# Patient Record
Sex: Male | Born: 1990 | Race: White | Hispanic: No | Marital: Single | Smoking: Never smoker
Health system: Southern US, Community
[De-identification: ages and names within clinical notes are randomized; demographics above are authoritative.]

## PROBLEM LIST (undated history)

## (undated) ENCOUNTER — Ambulatory Visit: Payer: BC Managed Care – PPO

## (undated) HISTORY — PX: VARICOSE VEIN SURGERY: SHX832

---
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
## 2012-11-19 ENCOUNTER — Ambulatory Visit: Payer: Self-pay | Admitting: Family Medicine

## 2013-02-24 ENCOUNTER — Ambulatory Visit: Payer: Self-pay

## 2015-09-30 ENCOUNTER — Ambulatory Visit: Payer: Self-pay | Admitting: Physician Assistant

## 2015-09-30 ENCOUNTER — Encounter: Payer: Self-pay | Admitting: Physician Assistant

## 2015-09-30 VITALS — BP 129/80 | HR 96 | Temp 98.3°F

## 2015-09-30 DIAGNOSIS — Z0000 Encounter for general adult medical examination without abnormal findings: Secondary | ICD-10-CM

## 2015-09-30 NOTE — Progress Notes (Signed)
S: here for screening physical to work for [employer], is currently working for [employer], no problems or complaints  O: vitals wnl, nad, lungs c t a, cv rrr, good strength, neuro intact  A: well adult, screening PE  P: see form that was filled out

## 2020-07-09 ENCOUNTER — Ambulatory Visit
Admission: EM | Admit: 2020-07-09 | Discharge: 2020-07-09 | Disposition: A | Discharge status: Home / Self Care | Payer: Self-pay | Attending: Internal Medicine | Admitting: Internal Medicine

## 2020-07-09 ENCOUNTER — Encounter: Payer: Self-pay | Admitting: Emergency Medicine

## 2020-07-09 ENCOUNTER — Other Ambulatory Visit: Payer: Self-pay

## 2020-07-09 DIAGNOSIS — L309 Dermatitis, unspecified: Secondary | ICD-10-CM

## 2020-07-09 DIAGNOSIS — L718 Other rosacea: Secondary | ICD-10-CM

## 2020-07-09 MED ORDER — DOXYCYCLINE MONOHYDRATE 100 MG PO TABS: ORAL_TABLET | ORAL | 0 refills | Status: DC

## 2020-07-09 MED ORDER — METHYLPREDNISOLONE 4 MG PO TBPK: ORAL_TABLET | ORAL | 0 refills | Status: DC

## 2020-07-09 NOTE — ED Triage Notes (Signed)
Patient c/o itchy red rash on his face and neck and arms that started spreading over this past week.  Patient states that the rash started above his eyes a month ago.

## 2020-07-09 NOTE — Discharge Instructions (Addendum)
Follow up with a primary care provider in the next month to see how you are doing and for how long you should continue the Doxycycline or maybe you may need auto immune testing if you dont improve.

## 2020-07-09 NOTE — ED Provider Notes (Signed)
[facility]    CSN: [id number] Arrival date & time: 07/09/20  1518      History   Chief Complaint Chief Complaint  Patient presents with  . Rash    HPI Manuel Collier is a 29 y.o. male. who presents with dry itchy skin rash that started on his upper lids late spring and has moved to his neck since. Around mid Summer moved to his face. At that time he had not been using anything new on his face or new detergents, lotions etc.  He changed soaps after the rash started but did not get better.  Had a video consult with a nurse who told him to try OTC eczema cream which caused his skin to burn, vaceline, and other lotions, aquafore which felt good on his skin, but did not resolve it. His face skin feels very dry and has skin flakes that come from lids and neck.  Moved to his inner elbows mildly  bilaterally x 2 weeks ago.  Has noticed that when he does not eat clean his rash gets worse.    History reviewed. No pertinent past medical history.  There are no problems to display for this patient.   History reviewed. No pertinent surgical history.     Home Medications    Prior to Admission medications   Medication Sig Start Date End Date Taking? Authorizing Provider  doxycycline (ADOXA) 100 MG tablet One bid x 2 weeks then one qd 07/09/20   Rodriguez-Southworth, Nettie Elm, PA-C  methylPREDNISolone (MEDROL DOSEPAK) 4 MG TBPK tablet Take as directed 07/09/20   Rodriguez-Southworth, Nettie Elm, PA-C    Family History Family History  Problem Relation Age of Onset  . Healthy Mother   . Healthy Father     Social History Social History   Tobacco Use  . Smoking status: Never Smoker  . Smokeless tobacco: Never Used  Vaping Use  . Vaping Use: Never used  Substance Use Topics  . Alcohol use: Yes    Alcohol/week: 0.0 standard drinks  . Drug use: Never     Allergies   Patient has no known allergies.   Review of Systems + for itchy rash. Negative for joint pains,  swelling, fever, myalgias, red eyes, appetite changes, change in activity. Or URI symptoms    Physical Exam Triage Vital Signs ED Triage Vitals  Enc Vitals Group     BP 07/09/20 1551 135/82     Pulse Rate 07/09/20 1551 81     Resp 07/09/20 1551 16     Temp 07/09/20 1551 98.6 F (37 C)     Temp Source 07/09/20 1551 Oral     SpO2 07/09/20 1551 100 %     Weight 07/09/20 1549 250 lb (113.4 kg)     Height 07/09/20 1549 6\' 3"  (1.905 m)     Head Circumference --      Peak Flow --      Pain Score 07/09/20 1549 5     Pain Loc --      Pain Edu? --      Excl. in GC? --    No data found.  Updated Vital Signs BP 135/82 (BP Location: Left Arm)   Pulse 81   Temp 98.6 F (37 C) (Oral)   Resp 16   Ht 6\' 3"  (1.905 m)   Wt 250 lb (113.4 kg)   SpO2 100%   BMI 31.25 kg/m   Visual Acuity Right Eye Distance:   Left Eye Distance:  Bilateral Distance:    Right Eye Near:   Left Eye Near:    Bilateral Near:     Physical Exam Vitals and nursing note reviewed.  Constitutional:      General: He is not in acute distress.    Appearance: He is not toxic-appearing.  HENT:     Head: Atraumatic.     Right Ear: External ear normal.     Left Ear: External ear normal.     Ears:     Comments: Dry scaly rash on the back of his ears    Nose: Nose normal.  Eyes:     General: No scleral icterus.    Conjunctiva/sclera: Conjunctivae normal.  Pulmonary:     Effort: Pulmonary effort is normal.  Musculoskeletal:        General: Normal range of motion.     Cervical back: Neck supple.  Skin:    General: Skin is warm and dry.     Findings: Erythema and rash present. No bruising.     Comments: FACE- with very dry skin on upper and lower lids, and scaling on neck area, the face area is just with mild erythema dry skin. Medial elbow's areas with mild erythematous dry patch of only 2x1/2 cm.   Neurological:     Mental Status: He is alert and oriented to person, place, and time.     Gait: Gait  normal.  Psychiatric:        Mood and Affect: Mood normal.        Behavior: Behavior normal.        Thought Content: Thought content normal.        Judgment: Judgment normal.      UC Treatments / Results  Labs (all labs ordered are listed, but only abnormal results are displayed) Labs Reviewed - No data to display  EKG   Radiology No results found.  Procedures Procedures (including critical care time)  Medications Ordered in UC Medications - No data to display  Initial Impression / Assessment and Plan / UC Course  I have reviewed the triage vital signs and the nursing notes. He has ocular Rosacea and eczema. I placed him on Doxy and Medrol dose pack. Needs to FU with PCP with in 30 days. See instructions.  Final Clinical Impressions(s) / UC Diagnoses   Final diagnoses:  Eczema, unspecified type  Ocular rosacea     Discharge Instructions     Follow up with a primary care provider in the next month to see how you are doing and for how long you should continue the Doxycycline or maybe you may need auto immune testing if you dont improve.     ED Prescriptions    Medication Sig Dispense Auth. Provider   doxycycline (ADOXA) 100 MG tablet One bid x 2 weeks then one qd 90 tablet Rodriguez-Southworth, Manuel Witt, PA-C   methylPREDNISolone (MEDROL DOSEPAK) 4 MG TBPK tablet Take as directed 21 tablet Rodriguez-Southworth, Nettie Elm, PA-C     PDMP not reviewed this encounter.   Garey Ham, [place] 07/09/20 1633

## 2020-07-23 ENCOUNTER — Other Ambulatory Visit: Payer: Self-pay | Admitting: Internal Medicine

## 2020-07-27 MED ORDER — TOBRADEX 0.3-0.1 % OP OINT: TOPICAL_OINTMENT | OPHTHALMIC | 0 refills | Status: DC

## 2020-07-27 MED ORDER — TRIAMCINOLONE ACETONIDE 0.025 % EX OINT: TOPICAL_OINTMENT | CUTANEOUS | 0 refills | Status: DC

## 2020-07-27 NOTE — Telephone Encounter (Signed)
I will send topical

## 2020-09-09 ENCOUNTER — Other Ambulatory Visit: Payer: Self-pay

## 2020-09-09 ENCOUNTER — Encounter: Payer: Self-pay | Admitting: Emergency Medicine

## 2020-09-09 ENCOUNTER — Ambulatory Visit
Admission: EM | Admit: 2020-09-09 | Discharge: 2020-09-09 | Disposition: A | Discharge status: Home / Self Care | Payer: BC Managed Care – PPO | Attending: Physician Assistant | Admitting: Physician Assistant

## 2020-09-09 DIAGNOSIS — L539 Erythematous condition, unspecified: Secondary | ICD-10-CM

## 2020-09-09 DIAGNOSIS — L03115 Cellulitis of right lower limb: Secondary | ICD-10-CM | POA: Diagnosis not present

## 2020-09-09 MED ORDER — DOXYCYCLINE HYCLATE 100 MG PO CAPS
100.0000 mg | ORAL_CAPSULE | Freq: Two times a day (BID) | ORAL | 0 refills | Status: AC
Start: 2020-09-09 — End: 2020-09-19

## 2020-09-09 MED ORDER — CEFTRIAXONE SODIUM 1 G IJ SOLR
1.0000 g | Freq: Once | INTRAMUSCULAR | Status: AC
  Administered 2020-09-09: 1 g via INTRAMUSCULAR

## 2020-09-09 NOTE — Discharge Instructions (Addendum)
It appears that you have an infection of the soft tissues of your right lower leg.  Begin doxycycline and complete full course.  You have been given an injection of ceftriaxone which is another antibiotic the clinic today since the infection was sudden onset and does encompass a large area already.  Take Tylenol or Motrin for any pain and elevate the area.  As discussed, there is low concern for DVT or blood clot in the leg at this time given the area that is affected.  However, you do have a risk factor since he did have a procedure on the veins about a month ago.  As discussed, you should be getting better with the use of antibiotics over the next couple days.  If your condition is not improving or if it worsens in any way you need to be seen again.  For any sudden acute worsening of symptoms you should go to the ED or call EMS.

## 2020-09-09 NOTE — ED Provider Notes (Signed)
[facility]    CSN: [id number] Arrival date & time: 09/09/20  1328      History   Chief Complaint Chief Complaint  Patient presents with   Cellulitis    right lower leg    HPI Manuel Collier is a 29 y.o. male presenting for onset of right lower leg redness and swelling yesterday.  He states that at came on rapidly.  The area of concern is of the anterior shin.  Patient admits to history of skin infection in this area but a year ago and says that his current symptoms are consistent with that.  He denies any fever.  Patient denies any injuries.  He states that he did have varicose vein surgery 4 to 5 weeks ago on his right thigh.  He says he did not have any procedure done on the calf.  Pain is moderate.  Not associated with any numbness, tingling or weakness.  Patient denies any history of DVT or clotting disorder.  Denies any immunocompromising or autoimmune diseases.  No history of cancer and no sedentary lifestyle or recent travel.  No other concerns today.  HPI  History reviewed. No pertinent past medical history.  There are no problems to display for this patient.   Past Surgical History:  Procedure Laterality Date   VARICOSE VEIN SURGERY         Home Medications    Prior to Admission medications   Medication Sig Start Date End Date Taking? Authorizing Provider  doxycycline (VIBRAMYCIN) 100 MG capsule Take 1 capsule (100 mg total) by mouth 2 (two) times daily for 10 days. 09/09/20 09/19/20  Eusebio Friendly B, PA-C  methylPREDNISolone (MEDROL DOSEPAK) 4 MG TBPK tablet Take as directed 07/09/20   Rodriguez-Southworth, Nettie Elm, PA-C  tobramycin-dexamethasone [facility]) ophthalmic ointment upply on eye lids rash bid x 7 days, then as needed until seen by dermatology 07/27/20   Rodriguez-Southworth, Nettie Elm, PA-C  triamcinolone (KENALOG) 0.025 % ointment Apply lightly to face rash x 7 days, then as needed until seen by dermatology( do not use on eye lids) 07/27/20    Rodriguez-Southworth, Nettie Elm, PA-C    Family History Family History  Problem Relation Age of Onset   Healthy Mother    Healthy Father     Social History Social History   Tobacco Use   Smoking status: Never Smoker   Smokeless tobacco: Never Used  Building services engineer Use: Never used  Substance Use Topics   Alcohol use: Yes    Alcohol/week: 0.0 standard drinks    Comment: social   Drug use: Never     Allergies   Patient has no known allergies.   Review of Systems Review of Systems  Constitutional: Negative for fatigue and fever.  Respiratory: Negative for cough, chest tightness and shortness of breath.   Cardiovascular: Positive for leg swelling. Negative for chest pain.  Musculoskeletal: Negative for arthralgias, gait problem, joint swelling and myalgias.  Skin: Positive for color change. Negative for rash and wound.  Neurological: Negative for weakness and numbness.     Physical Exam Triage Vital Signs ED Triage Vitals  Enc Vitals Group     BP 09/09/20 1339 (!) 145/97     Pulse Rate 09/09/20 1339 89     Resp 09/09/20 1339 18     Temp 09/09/20 1339 98.5 F (36.9 C)     Temp Source 09/09/20 1339 Oral     SpO2 09/09/20 1339 100 %     Weight 09/09/20 1338  260 lb (117.9 kg)     Height 09/09/20 1338 6\' 3"  (1.905 m)     Head Circumference --      Peak Flow --      Pain Score 09/09/20 1338 6     Pain Loc --      Pain Edu? --      Excl. in GC? --    No data found.  Updated Vital Signs BP (!) 145/97 (BP Location: Left Arm)    Pulse 89    Temp 98.5 F (36.9 C) (Oral)    Resp 18    Ht 6\' 3"  (1.905 m)    Wt 260 lb (117.9 kg)    SpO2 100%    BMI 32.50 kg/m       Physical Exam Vitals and nursing note reviewed.  Constitutional:      General: He is not in acute distress.    Appearance: Normal appearance. He is well-developed. He is not ill-appearing or toxic-appearing.  HENT:     Head: Normocephalic and atraumatic.  Eyes:     General: No scleral  icterus.    Conjunctiva/sclera: Conjunctivae normal.  Cardiovascular:     Rate and Rhythm: Normal rate and regular rhythm.     Heart sounds: No murmur heard.   Pulmonary:     Effort: Pulmonary effort is normal. No respiratory distress.     Breath sounds: Normal breath sounds.  Musculoskeletal:     Cervical back: Neck supple.  Skin:    General: Skin is warm and dry.     Comments: There is an area of erythema, warmth and induration of the right lower extremity--anterior lower leg. No calf tenderness, only TTP of affected area. Calves are both 17.5 in bilaterally. See images below.   Neurological:     General: No focal deficit present.     Mental Status: He is alert. Mental status is at baseline.     Motor: No weakness.     Gait: Gait normal.  Psychiatric:        Mood and Affect: Mood normal.        Behavior: Behavior normal.        Thought Content: Thought content normal.           UC Treatments / Results  Labs (all labs ordered are listed, but only abnormal results are displayed) Labs Reviewed - No data to display  EKG   Radiology No results found.  Procedures Procedures (including critical care time)  Medications Ordered in UC Medications  cefTRIAXone (ROCEPHIN) injection 1 g (has no administration in time range)    Initial Impression / Assessment and Plan / UC Course  I have reviewed the triage vital signs and the nursing notes.  Pertinent labs & imaging results that were available during my care of the patient were reviewed by me and considered in my medical decision making (see chart for details).   Exam is consistent with cellulitis. Patient given 1 gram ceftriaxone in clinic since this came on so rapidly and already affects are large area of shin. Also prescribed doxycycline oral. He does have risk for DVT since he had procedure on veins of leg (thigh) 4-5 weeks ago. He denies any procedure of the veins near affected area. No swelling of leg. No calf  tenderness. No other risk factors. Low suspicion for DVT at this time, but advised close follow up and strict ED precautions discussed.   Final Clinical Impressions(s) / UC Diagnoses   Final diagnoses:  Cellulitis of leg, right  Erythematous condition     Discharge Instructions     It appears that you have an infection of the soft tissues of your right lower leg.  Begin doxycycline and complete full course.  You have been given an injection of ceftriaxone which is another antibiotic the clinic today since the infection was sudden onset and does encompass a large area already.  Take Tylenol or Motrin for any pain and elevate the area.  As discussed, there is low concern for DVT or blood clot in the leg at this time given the area that is affected.  However, you do have a risk factor since he did have a procedure on the veins about a month ago.  As discussed, you should be getting better with the use of antibiotics over the next couple days.  If your condition is not improving or if it worsens in any way you need to be seen again.  For any sudden acute worsening of symptoms you should go to the ED or call EMS.    ED Prescriptions    Medication Sig Dispense Auth. Provider   doxycycline (VIBRAMYCIN) 100 MG capsule Take 1 capsule (100 mg total) by mouth 2 (two) times daily for 10 days. 20 capsule Shirlee Latch, PA-C     PDMP not reviewed this encounter.   Shirlee Latch, PA-C 09/11/20 [id number]

## 2020-09-09 NOTE — ED Triage Notes (Signed)
Patient in today c/o cellulitis on his right lower leg x 1 day. Patient states he was having chills last night, but didn't take temperature.

## 2022-03-25 ENCOUNTER — Other Ambulatory Visit: Payer: Self-pay

## 2022-03-25 ENCOUNTER — Ambulatory Visit
Admission: EM | Admit: 2022-03-25 | Discharge: 2022-03-25 | Disposition: A | Discharge status: Home / Self Care | Payer: BC Managed Care – PPO | Attending: Student | Admitting: Student

## 2022-03-25 ENCOUNTER — Ambulatory Visit (INDEPENDENT_AMBULATORY_CARE_PROVIDER_SITE_OTHER): Payer: BC Managed Care – PPO

## 2022-03-25 ENCOUNTER — Encounter: Payer: Self-pay | Admitting: Emergency Medicine

## 2022-03-25 DIAGNOSIS — M545 Low back pain, unspecified: Secondary | ICD-10-CM

## 2022-03-25 DIAGNOSIS — M5440 Lumbago with sciatica, unspecified side: Secondary | ICD-10-CM

## 2022-03-25 MED ORDER — TIZANIDINE HCL 4 MG PO TABS
4.0000 mg | ORAL_TABLET | Freq: Four times a day (QID) | ORAL | 0 refills | Status: DC | PRN
Start: 2022-03-25 — End: 2023-03-22

## 2022-03-25 MED ORDER — MELOXICAM 7.5 MG PO TABS: 7.5000 mg | ORAL_TABLET | Freq: Two times a day (BID) | ORAL | 0 refills | Status: DC | PRN

## 2022-03-25 MED ORDER — METHYLPREDNISOLONE SODIUM SUCC 40 MG IJ SOLR
60.0000 mg | Freq: Once | INTRAMUSCULAR | Status: AC
  Administered 2022-03-25: 60 mg via INTRAMUSCULAR

## 2022-03-25 NOTE — ED Triage Notes (Signed)
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  Patient states that he was performing CPR (chest compressions) about 3 weeks ago and when he went to turn felt a sharp pain in his lower back.  Patient reports ongoing pain in his mid lower back that radiates down his left leg.  ?

## 2022-03-25 NOTE — Discharge Instructions (Addendum)
-  Your x-ray looks normal, there are no obvious fractures or herniations in your spine. ?-Start the muscle relaxer-Zanaflex (tizanidine), up to 3 times daily for muscle spasms and pain.  This can make you drowsy, so take at bedtime or when you do not need to drive or operate machinery. ?-Meloxicam for pain, up to twice daily with meals.  Avoid other NSAIDs like ibuprofen or naproxen while taking this medication.  You can still take Tylenol 1000 mg 3 times daily. ?-You can also continue over-the-counter numbing cream, heating pad, etc. ?-If symptoms persist in 7 days, follow-up with an orthopedist. I recommend [facility], information below. ?

## 2022-03-25 NOTE — ED Provider Notes (Signed)
?[facility] ? ? ? ?CSN: [id number] ?Arrival date & time: 03/25/22  1050 ? ? ?  ? ?History   ?Chief Complaint ?Chief Complaint  ?Patient presents with  ? Back Pain  ? ? ?HPI ?Manuel Collier is a 31 y.o. male presenting for the following back injury.  History noncontributory, denies prior diagnosis of back injury.  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  This has persisted for the last 3 weeks.  He has attempted multiple over-the-counter interventions including Aleve, numbing cream, heating pad with minimal relief.  States that the pain is now primarily over his left paraspinous muscles, with radiation down the left leg with leaning forward.  Denies new weakness in the arms or legs.  Denies numbness in the inner thigh.  Denies change in bowel or bladder function including urinary retention. States he has had L sciatica in the past, but never this bad. ? ?HPI ? ?History reviewed. No pertinent past medical history. ? ?There are no problems to display for this patient. ? ? ?Past Surgical History:  ?Procedure Laterality Date  ? VARICOSE VEIN SURGERY    ? ? ? ? ? ?Home Medications   ? ?Prior to Admission medications   ?Medication Sig Start Date End Date Taking? Authorizing Provider  ?meloxicam (MOBIC) 7.5 MG tablet Take 1 tablet (7.5 mg total) by mouth 2 (two) times daily as needed for pain. 03/25/22  Yes Rhys Martini, PA-C  ?tiZANidine (ZANAFLEX) 4 MG tablet Take 1 tablet (4 mg total) by mouth every 6 (six) hours as needed for muscle spasms. 03/25/22  Yes Rhys Martini, PA-C  ? ? ?Family History ?Family History  ?Problem Relation Age of Onset  ? Healthy Mother   ? Healthy Father   ? ? ?Social History ?Social History  ? ?Tobacco Use  ? Smoking status: Never  ? Smokeless tobacco: Never  ?Vaping Use  ? Vaping Use: Never used  ?Substance Use Topics  ? Alcohol use: Yes  ?  Alcohol/week: 0.0  standard drinks  ?  Comment: social  ? Drug use: Never  ? ? ? ?Allergies   ?Patient has no known allergies. ? ? ?Review of Systems ?Review of Systems  ?Musculoskeletal:  Positive for back pain.  ?All other systems reviewed and are negative. ? ? ?Physical Exam ?Triage Vital Signs ?ED Triage Vitals  ?Enc Vitals Group  ?   BP 03/25/22 1103 (!) 149/98  ?   Pulse Rate 03/25/22 1103 (!) 115  ?   Resp 03/25/22 1103 15  ?   Temp 03/25/22 1103 98.5 ?F (36.9 ?C)  ?   Temp Source 03/25/22 1103 Oral  ?   SpO2 03/25/22 1103 97 %  ?   Weight 03/25/22 1100 (!) 310 lb (140.6 kg)  ?   Height 03/25/22 1100 6\' 3"  (1.905 m)  ?   Head Circumference --   ?   Peak Flow --   ?   Pain Score 03/25/22 1100 7  ?   Pain Loc --   ?   Pain Edu? --   ?   Excl. in GC? --   ? ?No data found. ? ?Updated Vital Signs ?BP (!) 149/98 (BP Location: Right Arm)   Pulse (!) 115   Temp 98.5 ?F (36.9 ?C) (Oral)   Resp 15   Ht 6\' 3"  (1.905 m)  Wt (!) 310 lb (140.6 kg)   SpO2 97%   BMI 38.75 kg/m?  ? ?Visual Acuity ?Right Eye Distance:   ?Left Eye Distance:   ?Bilateral Distance:   ? ?Right Eye Near:   ?Left Eye Near:    ?Bilateral Near:    ? ?Physical Exam ?Vitals reviewed.  ?Constitutional:   ?   General: He is not in acute distress. ?   Appearance: Normal appearance. He is not ill-appearing.  ?HENT:  ?   Head: Normocephalic and atraumatic.  ?Cardiovascular:  ?   Rate and Rhythm: Normal rate and regular rhythm.  ?   Heart sounds: Normal heart sounds.  ?Pulmonary:  ?   Effort: Pulmonary effort is normal.  ?   Breath sounds: Normal breath sounds and air entry.  ?Abdominal:  ?   Tenderness: There is no abdominal tenderness. There is no right CVA tenderness, left CVA tenderness, guarding or rebound.  ?Musculoskeletal:  ?   Cervical back: Normal range of motion. No swelling, deformity, signs of trauma, rigidity, spasms, tenderness, bony tenderness or crepitus. No pain with movement.  ?   Thoracic back: No swelling, deformity, signs of trauma, spasms,  tenderness or bony tenderness. Normal range of motion. No scoliosis.  ?   Lumbar back: No swelling, deformity, signs of trauma, spasms, tenderness or bony tenderness. Normal range of motion. Positive left straight leg raise test. Negative right straight leg raise test. No scoliosis.  ?   Comments: No reproducible pain to palpation. No midline spinous tenderness, deformity, stepoff.  Left-sided lumbar paraspinous muscle tenderness elicited with flexion lumbar spine.  Positive left straight leg raise.  Gait intact.  Heel and toe walk intact.  Strength and sensation grossly intact.  No saddle anesthesia. ? ?Absolutely no other injury, deformity, tenderness, ecchymosis, abrasion.  ?Neurological:  ?   General: No focal deficit present.  ?   Mental Status: He is alert.  ?   Cranial Nerves: No cranial nerve deficit.  ?Psychiatric:     ?   Mood and Affect: Mood normal.     ?   Behavior: Behavior normal.     ?   Thought Content: Thought content normal.     ?   Judgment: Judgment normal.  ? ? ? ?UC Treatments / Results  ?Labs ?(all labs ordered are listed, but only abnormal results are displayed) ?Labs Reviewed - No data to display ? ?EKG ? ? ?Radiology ?DG Lumbar Spine Complete ? ?Result Date: 03/25/2022 ?CLINICAL DATA:  Pain EXAM: LUMBAR SPINE - COMPLETE 4 VIEW COMPARISON:  None Available. FINDINGS: There is no evidence of lumbar spine fracture. Alignment is normal. Minimal degenerative disc disease consisting of osteophyte formation. Intervertebral disc spaces are maintained. IMPRESSION: No acute osseous abnormality. Electronically Signed   By: Allegra Lai M.D.   On: 03/25/2022 11:43   ? ?Procedures ?Procedures (including critical care time) ? ?Medications Ordered in UC ?Medications  ?methylPREDNISolone sodium succinate (SOLU-MEDROL) 40 mg/mL injection 60 mg (has no administration in time range)  ? ? ?Initial Impression / Assessment and Plan / UC Course  ?I have reviewed the triage vital signs and the nursing  notes. ? ?Pertinent labs & imaging results that were available during my care of the patient were reviewed by me and considered in my medical decision making (see chart for details). ? ?  ? ?This patient is a very pleasant 31 y.o. year old male presenting with lumbar strain with sciatica following injury x3 weeks.  ? ?Xray lumbar spine -  negative.  ? ?Has already attempted alleve and various OTC treatments. Will proceed with IM solumedrol, zanaflex, meloxicam. F/u with ortho if symptoms persist in 7 days, information provided.  ? ?ED return precautions discussed. Patient verbalizes understanding and agreement. He is an EMT. ? ?Final Clinical Impressions(s) / UC Diagnoses  ? ?Final diagnoses:  ?Back pain of lumbar region with sciatica  ? ? ? ?Discharge Instructions   ? ?  ?-Your x-ray looks normal, there are no obvious fractures or herniations in your spine. ?-Start the muscle relaxer-Zanaflex (tizanidine), up to 3 times daily for muscle spasms and pain.  This can make you drowsy, so take at bedtime or when you do not need to drive or operate machinery. ?-Meloxicam for pain, up to twice daily with meals.  Avoid other NSAIDs like ibuprofen or naproxen while taking this medication.  You can still take Tylenol 1000 mg 3 times daily. ?-You can also continue over-the-counter numbing cream, heating pad, etc. ?-If symptoms persist in 7 days, follow-up with an orthopedist. I recommend [facility], information below. ? ? ? ? ?ED Prescriptions   ? ? Medication Sig Dispense Auth. Provider  ? tiZANidine (ZANAFLEX) 4 MG tablet Take 1 tablet (4 mg total) by mouth every 6 (six) hours as needed for muscle spasms. 30 tablet Rhys MartiniGraham, Illyria Sobocinski E, PA-C  ? meloxicam (MOBIC) 7.5 MG tablet Take 1 tablet (7.5 mg total) by mouth 2 (two) times daily as needed for pain. 30 tablet Rhys MartiniGraham, Daliah Chaudoin E, PA-C  ? ?  ? ?PDMP not reviewed this encounter. ?  ?Rhys MartiniGraham, Dashay Giesler E, PA-C ?03/25/22 1203 ? ?

## 2022-11-08 IMAGING — CR DG LUMBAR SPINE COMPLETE 4+V
5 series · 5 of 5 positions shown · non-contrast
Comparison: None Available.

CLINICAL DATA: Pain

EXAM:
LUMBAR SPINE - COMPLETE 4 VIEW

[l-spine ap]
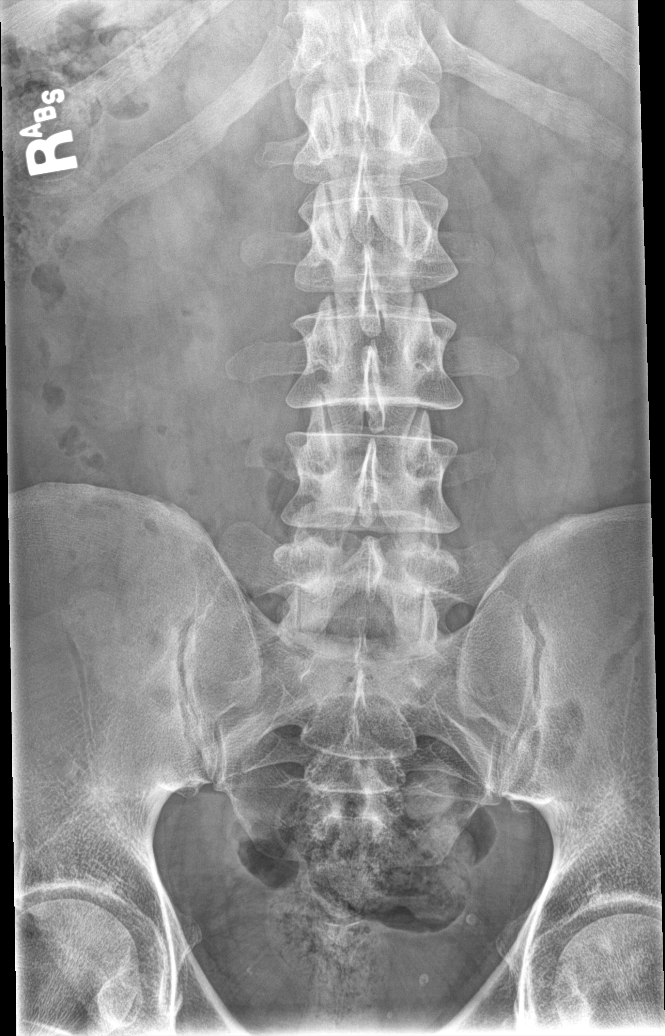

[l-spine obl (1 of 2)]
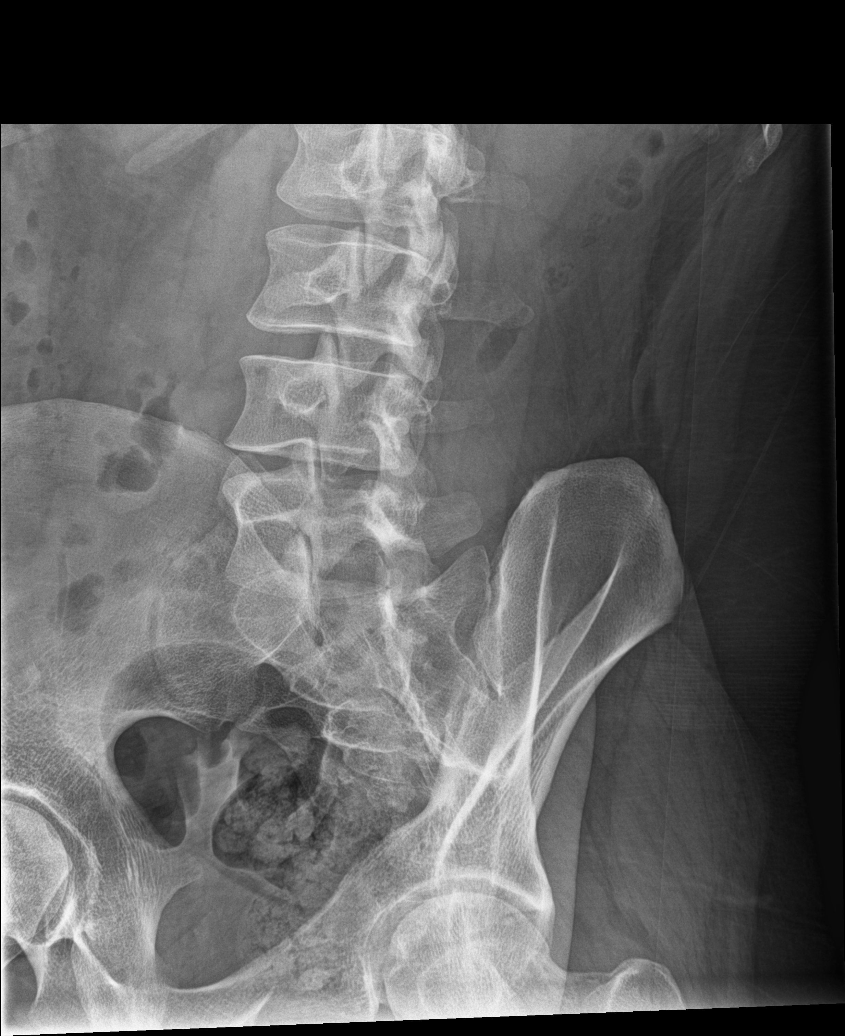

[l-spine obl (2 of 2)]
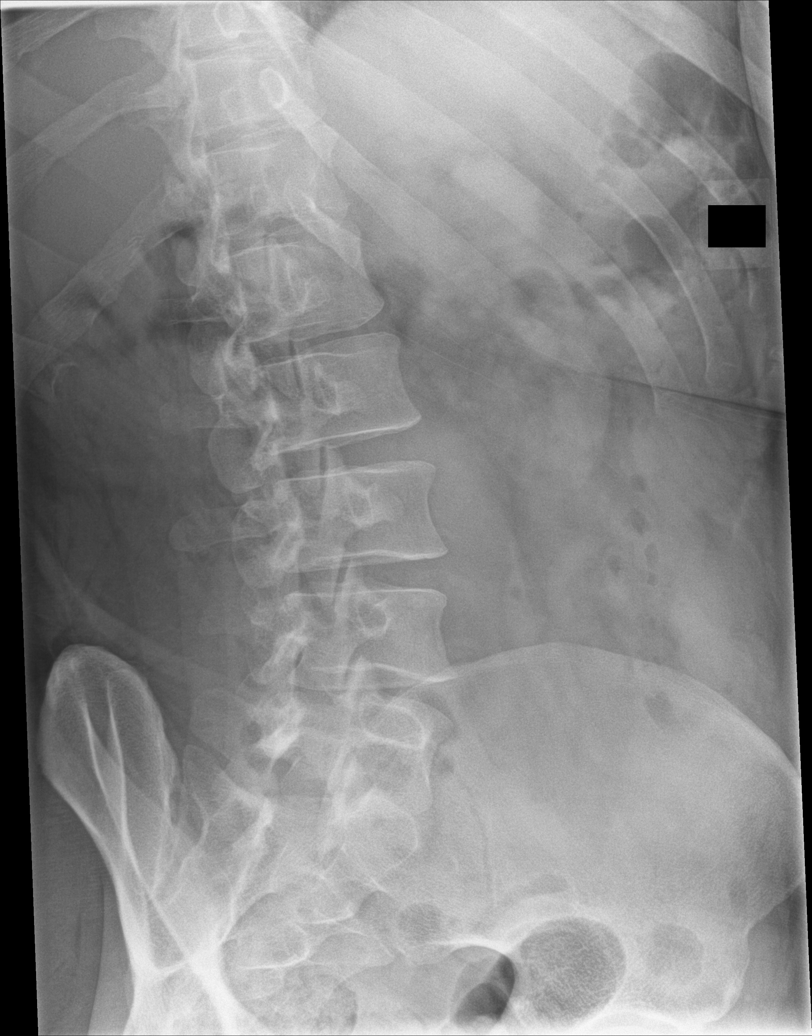

[l-spine spot]
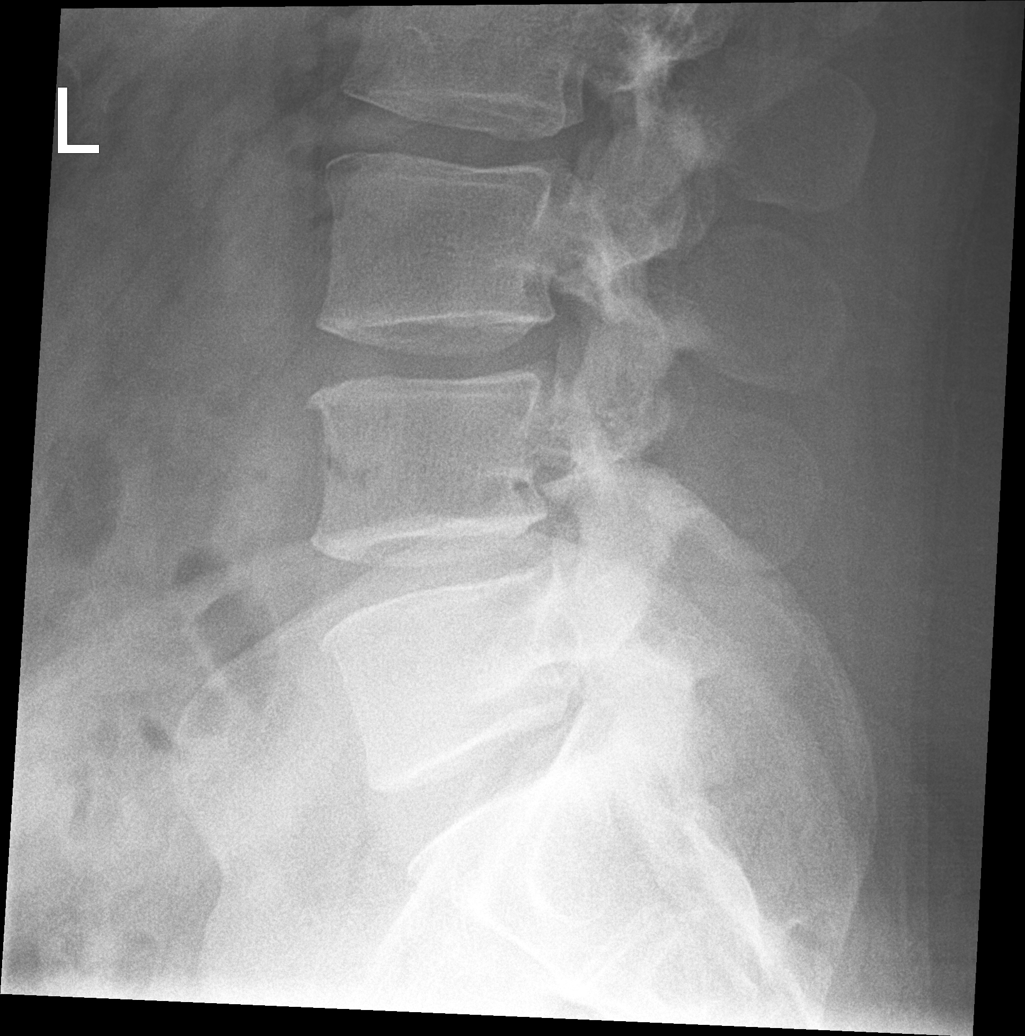

[l-spine lat]
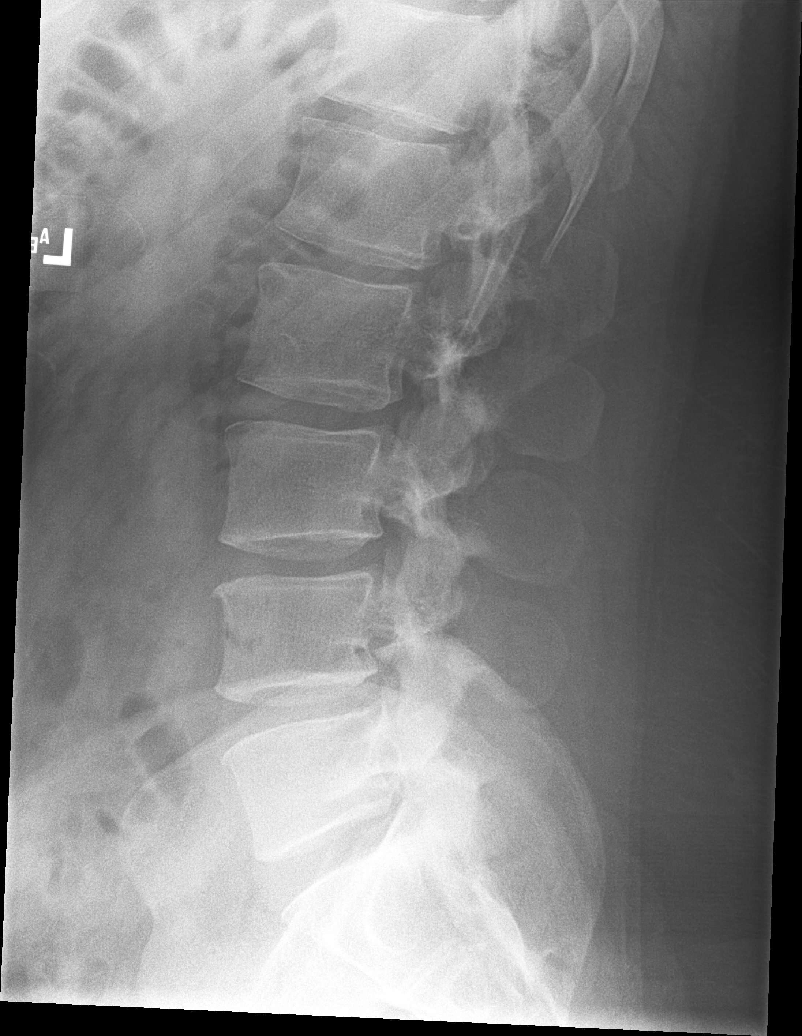

[5 of 5 positions shown; findings below may reference images not displayed]

FINDINGS: There is no evidence of lumbar spine fracture. Alignment is normal.
Minimal degenerative disc disease consisting of osteophyte
formation. Intervertebral disc spaces are maintained.
IMPRESSION: No acute osseous abnormality.

## 2023-03-22 ENCOUNTER — Ambulatory Visit
Admission: EM | Admit: 2023-03-22 | Discharge: 2023-03-22 | Disposition: A | Discharge status: Home / Self Care | Payer: BC Managed Care – PPO | Attending: Physician Assistant | Admitting: Physician Assistant

## 2023-03-22 DIAGNOSIS — S39012A Strain of muscle, fascia and tendon of lower back, initial encounter: Secondary | ICD-10-CM | POA: Diagnosis not present

## 2023-03-22 DIAGNOSIS — M545 Low back pain, unspecified: Secondary | ICD-10-CM

## 2023-03-22 MED ORDER — MELOXICAM 15 MG PO TABS: 15.0000 mg | ORAL_TABLET | Freq: Every day | ORAL | 0 refills | Status: AC

## 2023-03-22 MED ORDER — TIZANIDINE HCL 4 MG PO TABS: 4.0000 mg | ORAL_TABLET | Freq: Three times a day (TID) | ORAL | 0 refills | Status: AC | PRN

## 2023-03-22 NOTE — Discharge Instructions (Signed)

## 2023-03-22 NOTE — ED Triage Notes (Signed)
Pt reports he has left lower back pain x 3 days ago. Says he was laying on the floor on his stomach and when he went to get up and felt like he pulled something in his lower back. Hurts to sit, bend, and twist side to side.

## 2023-03-22 NOTE — ED Provider Notes (Signed)
[facility]    CSN: [id number] Arrival date & time: 03/22/23  1317      History   Chief Complaint Chief Complaint  Patient presents with   Back Pain    HPI Manuel Collier is a 32 y.o. male presenting for left-sided lower back pain for the past 2 to 3 days.  Patient reports that Manuel Collier was laying on his stomach on the floor and then when Manuel Collier went to get up off the floor and felt a sudden sharp pain in the left lower back.  Pain radiates a little bit into the left buttocks but denies any pain radiating down the leg.  No numbness, weakness or tingling.  Manuel Collier reports Manuel Collier had similar symptoms about the same time last year.  Was treated with NSAIDs and muscle relaxers and says symptoms got better within a week.  Manuel Collier has been trying over-the-counter medications himself without relief.  Manuel Collier denies any significant history of any major medical problems or back problems.  No other complaints.  HPI  History reviewed. No pertinent past medical history.  There are no problems to display for this patient.   Past Surgical History:  Procedure Laterality Date   VARICOSE VEIN SURGERY         Home Medications    Prior to Admission medications   Medication Sig Start Date End Date Taking? Authorizing Provider  meloxicam (MOBIC) 15 MG tablet Take 1 tablet (15 mg total) by mouth daily. 03/22/23 04/21/23  Eusebio Friendly B, PA-C  tiZANidine (ZANAFLEX) 4 MG tablet Take 1 tablet (4 mg total) by mouth every 8 (eight) hours as needed for muscle spasms. 03/22/23   Shirlee Latch, PA-C    Family History Family History  Problem Relation Age of Onset   Healthy Mother    Healthy Father     Social History Social History   Tobacco Use   Smoking status: Never   Smokeless tobacco: Never  Vaping Use   Vaping Use: Never used  Substance Use Topics   Alcohol use: Yes    Alcohol/week: 0.0 standard drinks of alcohol    Comment: social   Drug use: Never     Allergies   Patient has no known  allergies.   Review of Systems Review of Systems  Genitourinary:  Negative for difficulty urinating and flank pain.  Musculoskeletal:  Positive for back pain. Negative for arthralgias, gait problem and joint swelling.  Neurological:  Negative for weakness and numbness.     Physical Exam Triage Vital Signs ED Triage Vitals  Enc Vitals Group     BP      Pulse      Resp      Temp      Temp src      SpO2      Weight      Height      Head Circumference      Peak Flow      Pain Score      Pain Loc      Pain Edu?      Excl. in GC?    No data found.  Updated Vital Signs BP 130/89 (BP Location: Right Arm)   Pulse 81   Temp 98.4 F (36.9 C) (Oral)   Resp 20      Physical Exam Vitals and nursing note reviewed.  Constitutional:      General: Manuel Collier is not in acute distress.    Appearance: Normal appearance. Manuel Collier is well-developed. Manuel Collier  is not ill-appearing.  HENT:     Head: Normocephalic and atraumatic.  Eyes:     General: No scleral icterus.    Conjunctiva/sclera: Conjunctivae normal.  Cardiovascular:     Rate and Rhythm: Normal rate and regular rhythm.     Heart sounds: Normal heart sounds.  Pulmonary:     Effort: Pulmonary effort is normal. No respiratory distress.     Breath sounds: Normal breath sounds.  Musculoskeletal:     Cervical back: Neck supple.     Lumbar back: Tenderness (TTP left paralumar muscles) present. No bony tenderness. Decreased range of motion. Negative right straight leg raise test and negative left straight leg raise test.  Skin:    General: Skin is warm and dry.     Capillary Refill: Capillary refill takes less than 2 seconds.  Neurological:     Mental Status: Manuel Collier is alert. Mental status is at baseline.     Motor: No weakness.     Coordination: Coordination normal.     Gait: Gait normal.  Psychiatric:        Mood and Affect: Mood normal.        Behavior: Behavior normal.      UC Treatments / Results  Labs (all labs ordered are listed,  but only abnormal results are displayed) Labs Reviewed - No data to display  EKG   Radiology No results found.  Procedures Procedures (including critical care time)  Medications Ordered in UC Medications - No data to display  Initial Impression / Assessment and Plan / UC Course  I have reviewed the triage vital signs and the nursing notes.  Pertinent labs & imaging results that were available during my care of the patient were reviewed by me and considered in my medical decision making (see chart for details).   32 year old male presents for left lower back pain with radiation to the left buttocks x 2 to 3 days.  Symptoms started after Manuel Collier was laying on the floor on his belly and quickly got up off the floor.  No numbness, weakness or tingling.  No red flag signs or symptoms.  No relief with OTC meds.  On exam Manuel Collier has tenderness palpation of the left paralumbar muscles.  Negative straight leg raise.  Reduced range of motion especially with forward flexion.  Full strength of lower legs and normal gait.  Will treat at this time for lumbar strain with meloxicam and tizanidine.  Manuel Collier had these medications last year and says they worked well for him and Manuel Collier felt better within a few days.  ED precautions discussed.   Final Clinical Impressions(s) / UC Diagnoses   Final diagnoses:  Strain of lumbar region, initial encounter  Acute left-sided low back pain without sciatica     Discharge Instructions      BACK PAIN: Stressed avoiding painful activities . RICE (REST, ICE, COMPRESSION, ELEVATION) guidelines reviewed. May alternate ice and heat. Consider use of muscle rubs, Salonpas patches, etc. Use medications as directed including muscle relaxers if prescribed. Take anti-inflammatory medications as prescribed or OTC NSAIDs/Tylenol.  F/u with PCP in 7-10 days for reexamination, and please feel free to call or return to the urgent care at any time for any questions or concerns you may have and we  will be happy to help you!   BACK PAIN RED FLAGS: If the back pain acutely worsens or there are any red flag symptoms such as numbness/tingling, leg weakness, saddle anesthesia, or loss of bowel/bladder control, go immediately  to the ER. Follow up with [place] as scheduled or sooner if the pain does not begin to resolve or if it worsens before the follow up       ED Prescriptions     Medication Sig Dispense Auth. Provider   meloxicam (MOBIC) 15 MG tablet Take 1 tablet (15 mg total) by mouth daily. 30 tablet Eusebio Friendly B, PA-C   tiZANidine (ZANAFLEX) 4 MG tablet Take 1 tablet (4 mg total) by mouth every 8 (eight) hours as needed for muscle spasms. 15 tablet Gareth Morgan      PDMP not reviewed this encounter.   Shirlee Latch, PA-C 03/22/23 1424
